# Patient Record
Sex: Male | Born: 1980 | Race: White | Hispanic: No | Marital: Married | Smoking: Never smoker
Health system: Southern US, Community
[De-identification: ages and names within clinical notes are randomized; demographics above are authoritative.]

## PROBLEM LIST (undated history)

## (undated) DIAGNOSIS — S32040A Wedge compression fracture of fourth lumbar vertebra, initial encounter for closed fracture: Secondary | ICD-10-CM

## (undated) HISTORY — DX: Wedge compression fracture of fourth lumbar vertebra, initial encounter for closed fracture: S32.040A

---

## 2001-09-16 DIAGNOSIS — S32040A Wedge compression fracture of fourth lumbar vertebra, initial encounter for closed fracture: Secondary | ICD-10-CM

## 2001-09-16 HISTORY — DX: Wedge compression fracture of fourth lumbar vertebra, initial encounter for closed fracture: S32.040A

## 2001-09-16 HISTORY — PX: LACERATION REPAIR: SHX5168

## 2001-09-16 HISTORY — PX: OTHER SURGICAL HISTORY: SHX169

## 2002-02-16 ENCOUNTER — Encounter: Payer: Self-pay | Admitting: Surgery

## 2002-02-16 ENCOUNTER — Emergency Department (HOSPITAL_COMMUNITY): Admission: AC | Admit: 2002-02-16 | Discharge: 2002-02-16 | Payer: Self-pay

## 2006-04-28 ENCOUNTER — Ambulatory Visit: Payer: Self-pay | Admitting: Endocrinology

## 2007-05-30 ENCOUNTER — Encounter: Payer: Self-pay | Admitting: Endocrinology

## 2014-09-22 ENCOUNTER — Other Ambulatory Visit: Payer: Self-pay | Admitting: Allergy and Immunology

## 2014-09-22 ENCOUNTER — Ambulatory Visit
Admission: RE | Admit: 2014-09-22 | Discharge: 2014-09-22 | Disposition: A | Discharge status: Home / Self Care | Payer: BLUE CROSS/BLUE SHIELD | Source: Ambulatory Visit | Attending: Allergy and Immunology | Admitting: Allergy and Immunology

## 2014-09-22 DIAGNOSIS — R059 Cough, unspecified: Secondary | ICD-10-CM

## 2014-09-22 DIAGNOSIS — R05 Cough: Secondary | ICD-10-CM

## 2016-05-20 IMAGING — CR DG CHEST 2V
2 series · 2 of 2 positions shown · non-contrast
Comparison: None.

CLINICAL DATA: Chronic cough.

EXAM:
CHEST  2 VIEW

[view not recorded (1 of 2)]
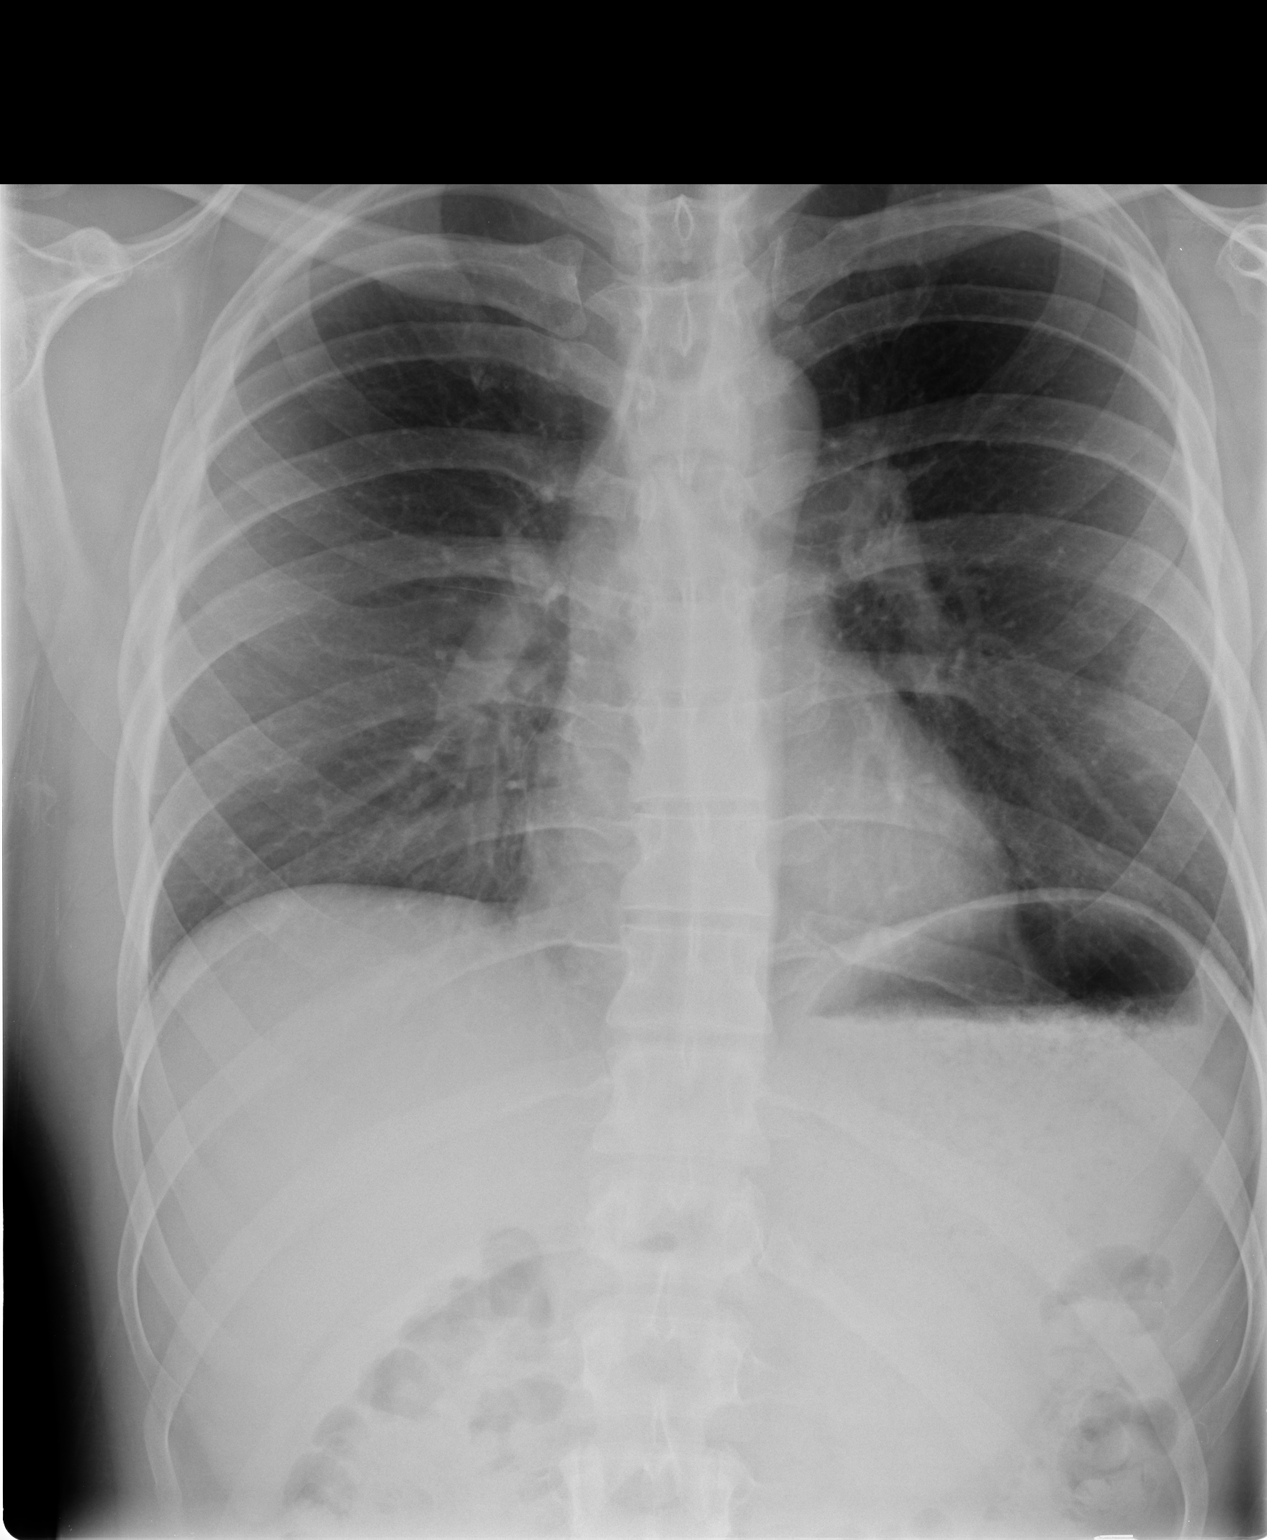

[view not recorded (2 of 2)]
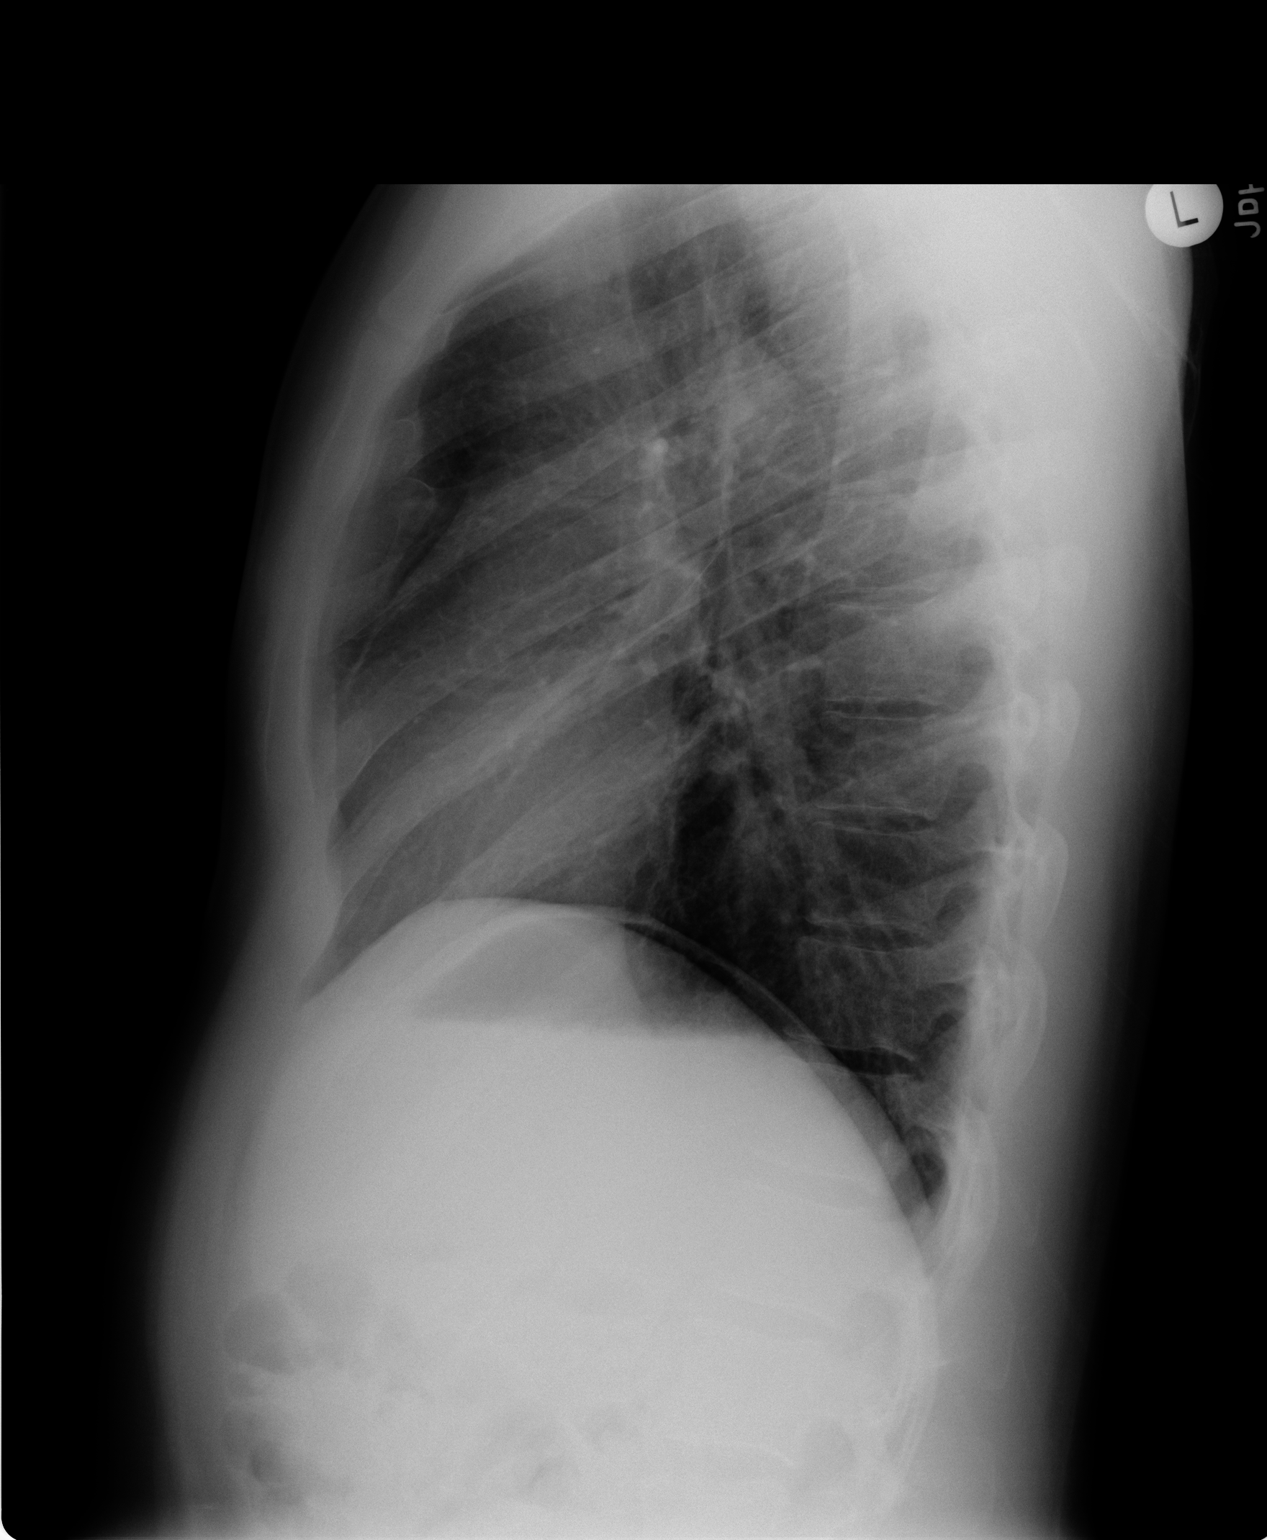

[2 of 2 positions shown; findings below may reference images not displayed]

FINDINGS: Mediastinum and hilar structures are normal. Lungs are clear. No
pleural effusion or pneumothorax. Heart size normal. Congenital
deformity right anterior fourth rib. No acute bony abnormality.
IMPRESSION: No active cardiopulmonary disease.

## 2016-06-03 ENCOUNTER — Encounter: Payer: Self-pay | Admitting: Internal Medicine

## 2016-07-03 ENCOUNTER — Encounter (INDEPENDENT_AMBULATORY_CARE_PROVIDER_SITE_OTHER): Payer: Self-pay

## 2016-07-03 ENCOUNTER — Ambulatory Visit (INDEPENDENT_AMBULATORY_CARE_PROVIDER_SITE_OTHER): Payer: BLUE CROSS/BLUE SHIELD | Admitting: Internal Medicine

## 2016-07-03 ENCOUNTER — Encounter: Payer: Self-pay | Admitting: Internal Medicine

## 2016-07-03 ENCOUNTER — Other Ambulatory Visit (INDEPENDENT_AMBULATORY_CARE_PROVIDER_SITE_OTHER): Payer: BLUE CROSS/BLUE SHIELD

## 2016-07-03 VITALS — BP 114/74 | HR 68 | Ht 74.0 in | Wt 191.0 lb

## 2016-07-03 DIAGNOSIS — Z8349 Family history of other endocrine, nutritional and metabolic diseases: Secondary | ICD-10-CM | POA: Diagnosis not present

## 2016-07-03 DIAGNOSIS — R748 Abnormal levels of other serum enzymes: Secondary | ICD-10-CM

## 2016-07-03 LAB — HEPATIC FUNCTION PANEL
ALBUMIN: 4.6 g/dL (ref 3.5–5.2)
ALT: 54 U/L — AB (ref 0–53)
AST: 39 U/L — ABNORMAL HIGH (ref 0–37)
Alkaline Phosphatase: 61 U/L (ref 39–117)
Bilirubin, Direct: 0.1 mg/dL (ref 0.0–0.3)
Total Bilirubin: 0.6 mg/dL (ref 0.2–1.2)
Total Protein: 7.6 g/dL (ref 6.0–8.3)

## 2016-07-03 LAB — FERRITIN: Ferritin: 119.1 ng/mL (ref 22.0–322.0)

## 2016-07-03 NOTE — Patient Instructions (Signed)
   Please go to the basement for the lab tests. We will call results and plans.  I appreciate the opportunity to care for you. Iva Booparl E. Gessner, MD, Clementeen GrahamFACG
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
## 2016-07-08 LAB — HEMOCHROMATOSIS DNA-PCR(C282Y,H63D)

## 2016-07-09 ENCOUNTER — Other Ambulatory Visit: Payer: Self-pay

## 2016-07-09 DIAGNOSIS — R748 Abnormal levels of other serum enzymes: Secondary | ICD-10-CM

## 2016-07-09 NOTE — Progress Notes (Signed)
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  Further w/ needed 1) Limited RUQ US - re: abnormal transaminases 2) ANA, ceruloplasmin, Hep B S Ag and Core Ab total and S ab, alpha-1 antitrypsin level, TTG Ab and IgA level, mitochondrial Abs

## 2016-07-10 ENCOUNTER — Other Ambulatory Visit (INDEPENDENT_AMBULATORY_CARE_PROVIDER_SITE_OTHER): Payer: BLUE CROSS/BLUE SHIELD

## 2016-07-10 ENCOUNTER — Ambulatory Visit (HOSPITAL_COMMUNITY)
Admission: RE | Admit: 2016-07-10 | Discharge: 2016-07-10 | Disposition: A | Discharge status: Home / Self Care | Payer: BLUE CROSS/BLUE SHIELD | Source: Ambulatory Visit | Attending: Internal Medicine | Admitting: Internal Medicine

## 2016-07-10 DIAGNOSIS — R748 Abnormal levels of other serum enzymes: Secondary | ICD-10-CM

## 2016-07-10 DIAGNOSIS — K8020 Calculus of gallbladder without cholecystitis without obstruction: Secondary | ICD-10-CM | POA: Insufficient documentation

## 2016-07-10 LAB — IGA: IGA: 333 mg/dL (ref 68–378)

## 2016-07-11 LAB — TISSUE TRANSGLUTAMINASE, IGA: TISSUE TRANSGLUTAMINASE AB, IGA: 1 U/mL (ref <4)

## 2016-07-11 LAB — HEPATITIS B CORE ANTIBODY, TOTAL: HEP B C TOTAL AB: NONREACTIVE

## 2016-07-11 LAB — HEPATITIS B SURFACE ANTIGEN: Hepatitis B Surface Ag: NEGATIVE

## 2016-07-11 LAB — ANA: Anti Nuclear Antibody(ANA): NEGATIVE

## 2016-07-11 LAB — HEPATITIS B SURFACE ANTIBODY, QUANTITATIVE: HEPATITIS B-POST: 50.2 m[IU]/mL

## 2016-07-12 LAB — CERULOPLASMIN: CERULOPLASMIN: 27 mg/dL (ref 18–36)

## 2016-07-12 LAB — ALPHA-1-ANTITRYPSIN: A-1 Antitrypsin, Ser: 112 mg/dL (ref 83–199)

## 2016-07-12 LAB — MITOCHONDRIAL ANTIBODIES: Mitochondrial M2 Ab, IgG: <=20 Units (ref <=20.0)

## 2016-07-15 NOTE — Progress Notes (Signed)
US showed gallstones - not a problem - not causing abnl LFT's and not having sxs The blood tests looking for a cause of LFT abnormalities are all ok  One other possible cause is skeletal muscle releae - is he a heavy exerciser? Does he have muscle pain?

## 2016-07-16 ENCOUNTER — Other Ambulatory Visit: Payer: Self-pay

## 2016-07-16 DIAGNOSIS — R7989 Other specified abnormal findings of blood chemistry: Secondary | ICD-10-CM

## 2016-07-16 DIAGNOSIS — R945 Abnormal results of liver function studies: Principal | ICD-10-CM

## 2016-07-16 NOTE — Progress Notes (Signed)
OK - cause of abnormal transaminases is unclear then  I would recommend periodic check of the LFT's A liver biopsy would tell us more but I am not inclined to do that unless would be necessary to satisfy insurance company.  Repeat LFT's in 3 months  He did this for insurance purposes - what does he need from me to help with that - ? Letter?

## 2016-07-23 ENCOUNTER — Encounter: Payer: Self-pay | Admitting: Internal Medicine

## 2017-04-07 DIAGNOSIS — J029 Acute pharyngitis, unspecified: Secondary | ICD-10-CM | POA: Diagnosis not present

## 2017-07-21 DIAGNOSIS — Z23 Encounter for immunization: Secondary | ICD-10-CM | POA: Diagnosis not present

## 2018-01-21 IMAGING — US US ABDOMEN LIMITED
1 series · 14 of 25 positions shown · non-contrast
Comparison: None.

CLINICAL DATA: Elevated liver function tests

EXAM:
US ABDOMEN LIMITED - RIGHT UPPER QUADRANT

[Series 1: us abdomen limited · 0.23mm/px · 14 of 54 slices shown]
[im 1/54]
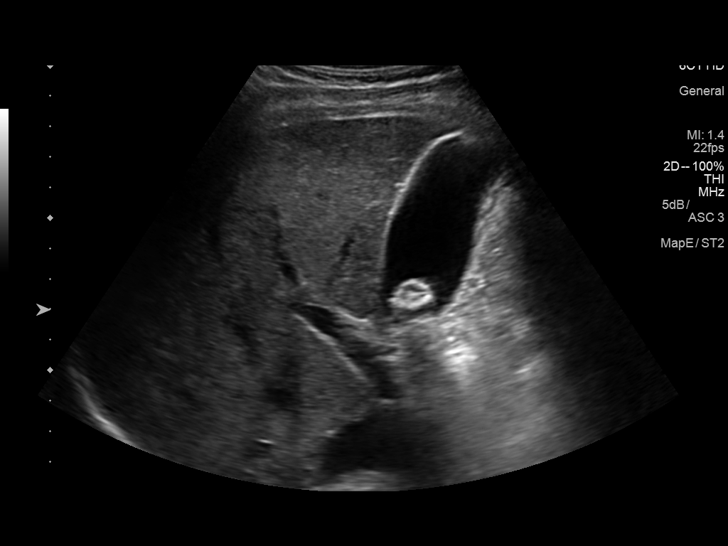
[im 5/54]
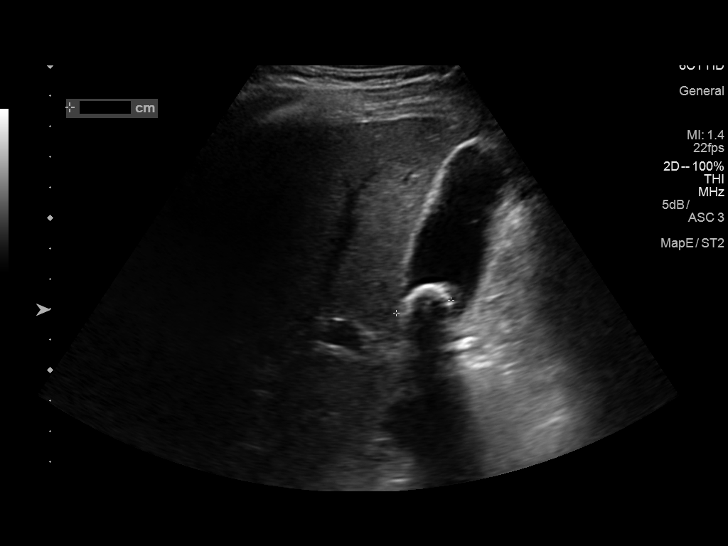
[im 9/54]
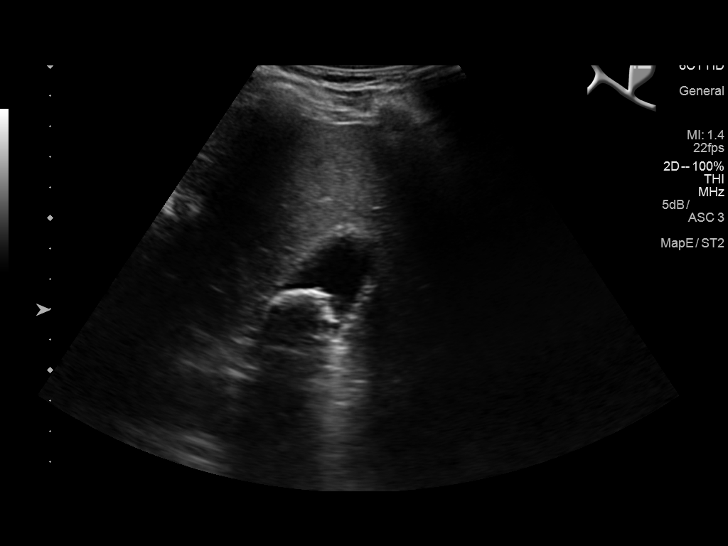
[im 14/54]
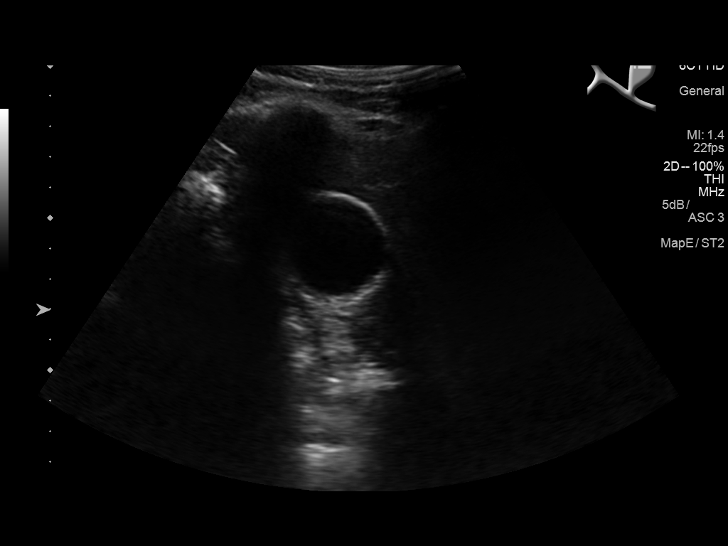
[im 18/54]
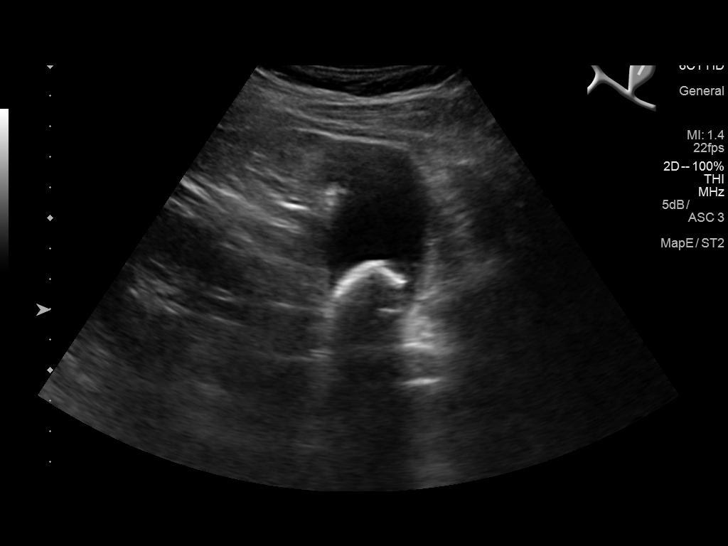
[im 20/54]
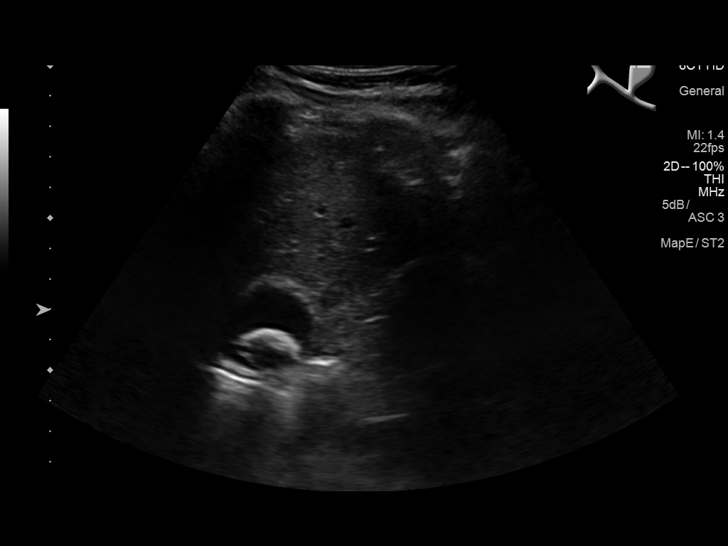
[im 25/54]
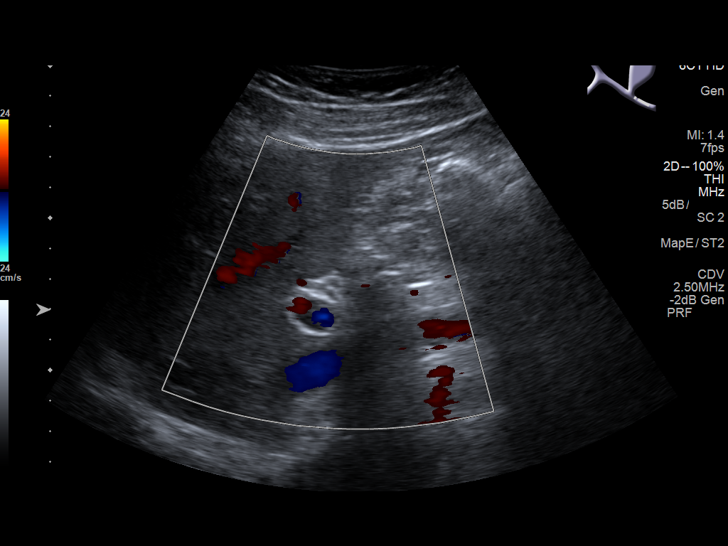
[im 29/54]
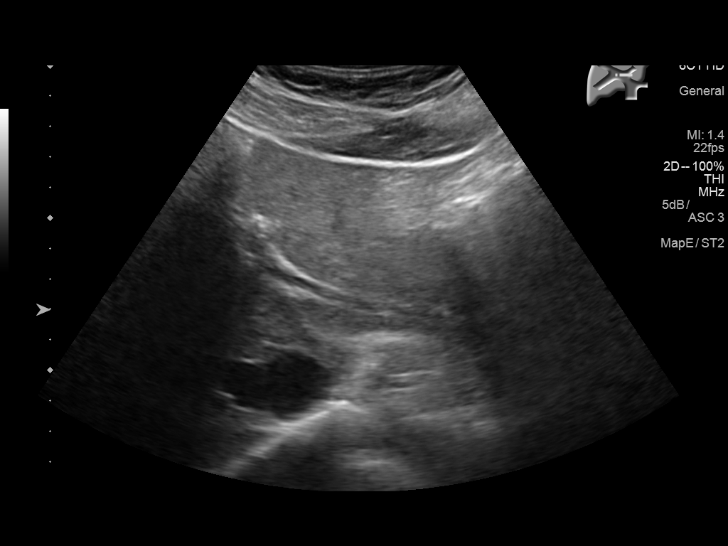
[im 34/54]
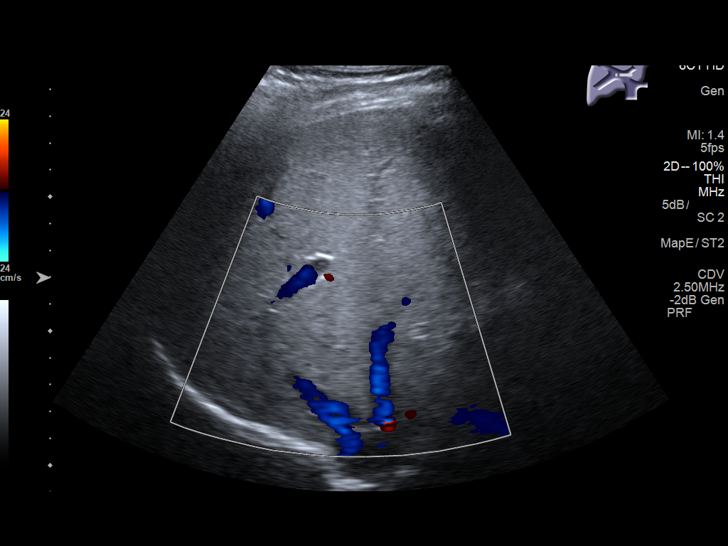
[im 36/54]
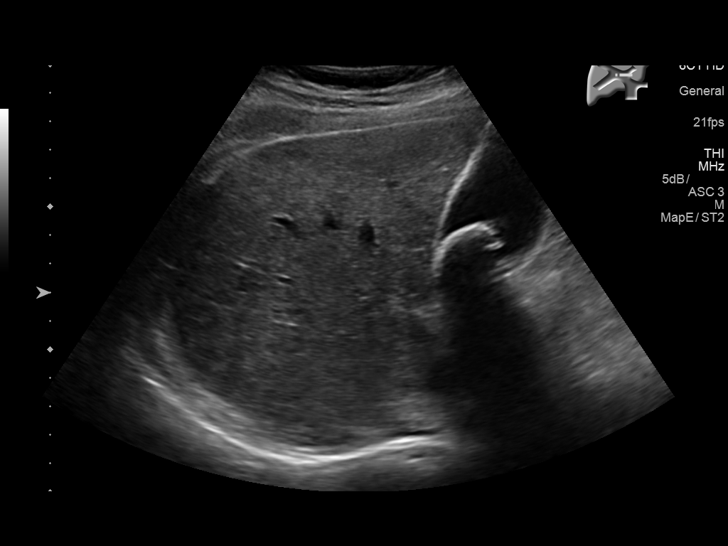
[im 40/54]
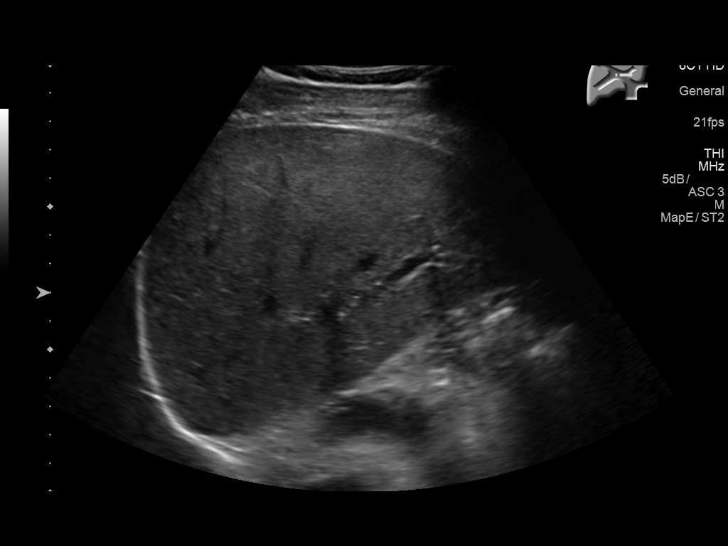
[im 45/54]
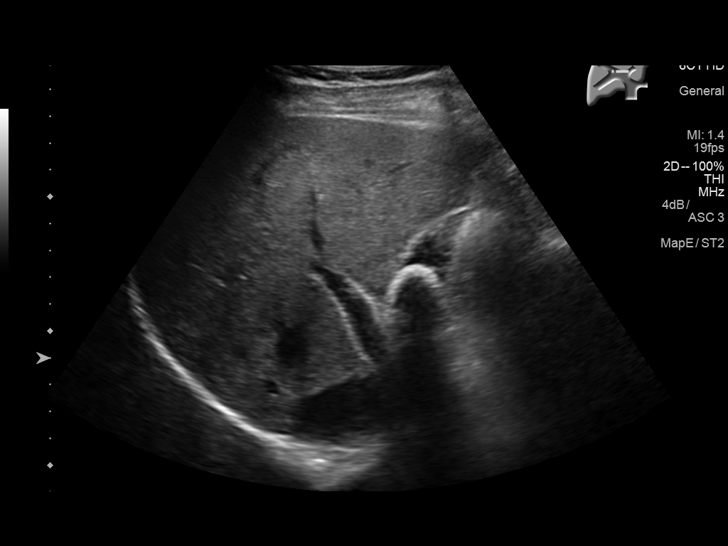
[im 49/54]
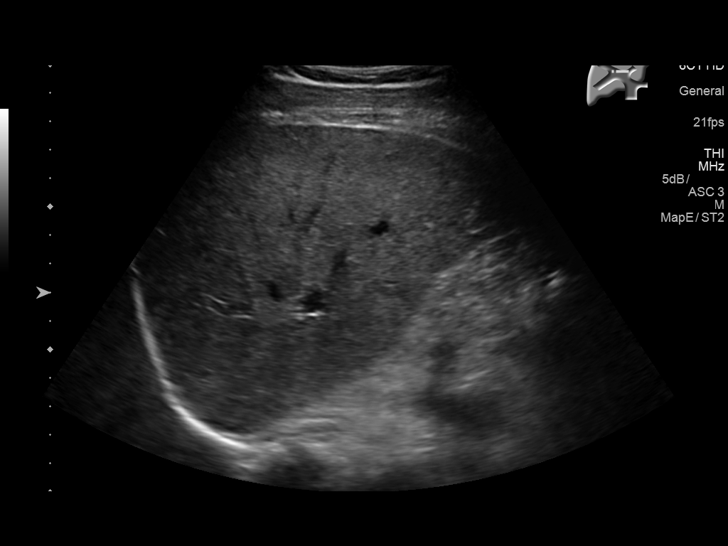
[im 54/54]
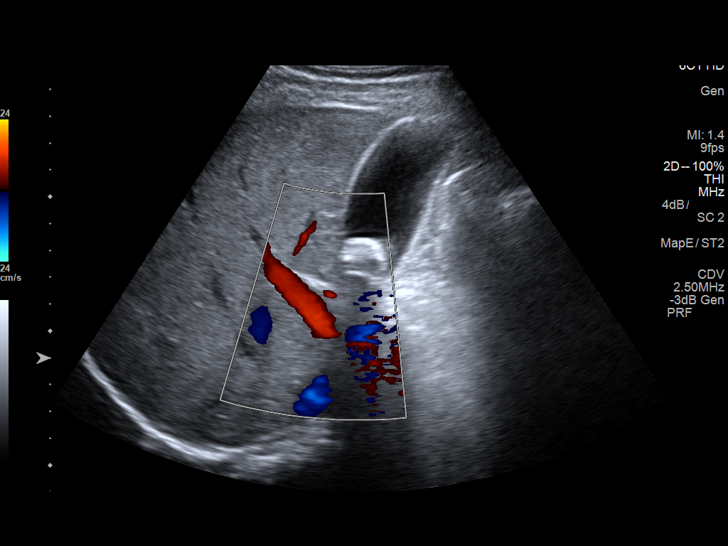

[14 of 25 positions shown; findings below may reference images not displayed]

FINDINGS: Gallbladder:

There is a 1.9 cm gallstone no wall thickening, Murphy sign, or
pericholecystic fluid.

Common bile duct:

Diameter: 2.1 mm

Liver:

No focal lesion identified. Within normal limits in parenchymal
echogenicity.
IMPRESSION: Cholelithiasis.

## 2018-10-30 DIAGNOSIS — E559 Vitamin D deficiency, unspecified: Secondary | ICD-10-CM | POA: Diagnosis not present

## 2018-10-30 DIAGNOSIS — R5383 Other fatigue: Secondary | ICD-10-CM | POA: Diagnosis not present

## 2018-10-30 DIAGNOSIS — R945 Abnormal results of liver function studies: Secondary | ICD-10-CM | POA: Diagnosis not present

## 2018-11-03 DIAGNOSIS — R5383 Other fatigue: Secondary | ICD-10-CM | POA: Diagnosis not present

## 2018-11-13 DIAGNOSIS — R5383 Other fatigue: Secondary | ICD-10-CM | POA: Diagnosis not present

## 2019-11-19 ENCOUNTER — Ambulatory Visit: Payer: Self-pay | Attending: Internal Medicine

## 2019-11-19 DIAGNOSIS — Z23 Encounter for immunization: Secondary | ICD-10-CM | POA: Insufficient documentation

## 2019-11-19 NOTE — Progress Notes (Signed)
   Covid-19 Vaccination Clinic  Name:  Mitchell Arellano    MRN: [id number] DOB: 1981-03-02  11/19/2019  [name] was observed post Covid-19 immunization for 15 minutes without incident. He was provided with Vaccine Information Sheet and instruction to access the V-Safe system.   [name] was instructed to call 911 with any severe reactions post vaccine: Marland Kitchen Difficulty breathing  . Swelling of face and throat  . A fast heartbeat  . A bad rash all over body  . Dizziness and weakness

## 2019-12-15 ENCOUNTER — Ambulatory Visit: Payer: Self-pay | Attending: Internal Medicine

## 2019-12-15 DIAGNOSIS — Z23 Encounter for immunization: Secondary | ICD-10-CM

## 2019-12-15 NOTE — Progress Notes (Signed)
   Covid-19 Vaccination Clinic  Name:  Mitchell Arellano    MRN: [id number] DOB: 11-07-1980  12/15/2019  Mr. Hamre was observed post Covid-19 immunization for 15 minutes without incident. He was provided with Vaccine Information Sheet and instruction to access the V-Safe system.   Mr. Carton was instructed to call 911 with any severe reactions post vaccine: Marland Kitchen Difficulty breathing  . Swelling of face and throat  . A fast heartbeat  . A bad rash all over body  . Dizziness and weakness   Immunizations Administered    Name Date Dose VIS Date Route   Pfizer COVID-19 Vaccine 12/15/2019 12:56 PM 0.3 mL 08/27/2019 Intramuscular   Manufacturer: [employer], [employer]   Lot: [id number]   NDC: [address]-3

## 2024-07-18 ENCOUNTER — Emergency Department (HOSPITAL_BASED_OUTPATIENT_CLINIC_OR_DEPARTMENT_OTHER)

## 2024-07-18 ENCOUNTER — Emergency Department (HOSPITAL_BASED_OUTPATIENT_CLINIC_OR_DEPARTMENT_OTHER)
Admission: EM | Admit: 2024-07-18 | Discharge: 2024-07-19 | Disposition: A | Discharge status: Home / Self Care | Attending: Emergency Medicine | Admitting: Emergency Medicine

## 2024-07-18 ENCOUNTER — Encounter (HOSPITAL_BASED_OUTPATIENT_CLINIC_OR_DEPARTMENT_OTHER): Payer: Self-pay | Admitting: Emergency Medicine

## 2024-07-18 DIAGNOSIS — R55 Syncope and collapse: Secondary | ICD-10-CM | POA: Diagnosis not present

## 2024-07-18 DIAGNOSIS — Z23 Encounter for immunization: Secondary | ICD-10-CM | POA: Diagnosis not present

## 2024-07-18 DIAGNOSIS — S098XXA Other specified injuries of head, initial encounter: Secondary | ICD-10-CM

## 2024-07-18 DIAGNOSIS — S0101XA Laceration without foreign body of scalp, initial encounter: Secondary | ICD-10-CM | POA: Diagnosis not present

## 2024-07-18 DIAGNOSIS — Y92511 Restaurant or cafe as the place of occurrence of the external cause: Secondary | ICD-10-CM | POA: Diagnosis not present

## 2024-07-18 DIAGNOSIS — S0990XA Unspecified injury of head, initial encounter: Secondary | ICD-10-CM | POA: Diagnosis present

## 2024-07-18 DIAGNOSIS — W1830XA Fall on same level, unspecified, initial encounter: Secondary | ICD-10-CM | POA: Diagnosis not present

## 2024-07-18 LAB — URINE DRUG SCREEN
Amphetamines: NEGATIVE
Barbiturates: NEGATIVE
Benzodiazepines: NEGATIVE
Cocaine: NEGATIVE
Fentanyl: NEGATIVE
Methadone Scn, Ur: NEGATIVE
Opiates: NEGATIVE
Tetrahydrocannabinol: POSITIVE — AB

## 2024-07-18 LAB — CBC WITH DIFFERENTIAL/PLATELET
Abs Immature Granulocytes: 0.01 K/uL (ref 0.00–0.07)
Basophils Absolute: 0 K/uL (ref 0.0–0.1)
Basophils Relative: 0 %
Eosinophils Absolute: 0.1 K/uL (ref 0.0–0.5)
Eosinophils Relative: 1 %
HCT: 47.4 % (ref 39.0–52.0)
Hemoglobin: 16.7 g/dL (ref 13.0–17.0)
Immature Granulocytes: 0 %
Lymphocytes Relative: 24 %
Lymphs Abs: 1.3 K/uL (ref 0.7–4.0)
MCH: 32.9 pg (ref 26.0–34.0)
MCHC: 35.2 g/dL (ref 30.0–36.0)
MCV: 93.3 fL (ref 80.0–100.0)
Monocytes Absolute: 0.4 K/uL (ref 0.1–1.0)
Monocytes Relative: 8 %
Neutro Abs: 3.8 K/uL (ref 1.7–7.7)
Neutrophils Relative %: 67 %
Platelets: 266 K/uL (ref 150–400)
RBC: 5.08 MIL/uL (ref 4.22–5.81)
RDW: 12.9 % (ref 11.5–15.5)
WBC: 5.6 K/uL (ref 4.0–10.5)
nRBC: 0 % (ref 0.0–0.2)

## 2024-07-18 LAB — BASIC METABOLIC PANEL WITH GFR
Anion gap: 12 (ref 5–15)
BUN: 14 mg/dL (ref 6–20)
CO2: 27 mmol/L (ref 22–32)
Calcium: 9.9 mg/dL (ref 8.9–10.3)
Chloride: 101 mmol/L (ref 98–111)
Creatinine, Ser: 1.04 mg/dL (ref 0.61–1.24)
GFR, Estimated: >60 mL/min (ref >60)
Glucose, Bld: 114 mg/dL — ABNORMAL HIGH (ref 70–99)
Potassium: 3.5 mmol/L (ref 3.5–5.1)
Sodium: 140 mmol/L (ref 135–145)

## 2024-07-18 LAB — LACTIC ACID, PLASMA
Lactic Acid, Venous: 2.2 mmol/L (ref 0.5–1.9)
Lactic Acid, Venous: 1.5 mmol/L (ref 0.5–1.9)

## 2024-07-18 LAB — TROPONIN T, HIGH SENSITIVITY
Troponin T High Sensitivity: <15 ng/L (ref 0–19)
Troponin T High Sensitivity: <15 ng/L (ref 0–19)

## 2024-07-18 LAB — ETHANOL: Alcohol, Ethyl (B): <15 mg/dL (ref <15)

## 2024-07-18 LAB — CK: Total CK: 123 U/L (ref 49–397)

## 2024-07-18 LAB — CBG MONITORING, ED: Glucose-Capillary: 140 mg/dL — ABNORMAL HIGH (ref 70–99)

## 2024-07-18 MED ORDER — TETANUS-DIPHTH-ACELL PERTUSSIS 5-2-15.5 LF-MCG/0.5 IM SUSP
0.5000 mL | Freq: Once | INTRAMUSCULAR | Status: AC
  Administered 2024-07-18: 0.5 mL via INTRAMUSCULAR
  Filled 2024-07-18: qty 0.5

## 2024-07-18 MED ORDER — SODIUM CHLORIDE 0.9 % IV BOLUS
1000.0000 mL | Freq: Once | INTRAVENOUS | Status: AC
  Administered 2024-07-18: 1000 mL via INTRAVENOUS

## 2024-07-18 MED ORDER — LIDOCAINE-EPINEPHRINE (PF) 2 %-1:200000 IJ SOLN
10.0000 mL | Freq: Once | INTRAMUSCULAR | Status: AC
  Administered 2024-07-18: 10 mL
  Filled 2024-07-18: qty 20

## 2024-07-18 NOTE — ED Provider Notes (Signed)
 [place] EMERGENCY DEPARTMENT AT [facility] Provider Note   CSN: [id number] Arrival date & time: 07/18/24  1916     Patient presents with: Loss of Consciousness   Mitchell Arellano is a 43 y.o. male.  {Add pertinent medical, surgical, social history, OB history to YEP:67052} Patient is a 43 year old male presenting for syncope.  Patient states he was at a restaurant eating when he got lightheaded, dizzy, nauseous.  He states he got up to walk to the bathroom and passed out on his way back.  He states was only out for couple seconds.  His wife states he was acting appropriately soon as he woke up.  They deny any shaking or seizure-like activity.  Denies any recent illness including no fevers, chills, vomiting, coughing, abdominal pain, or diarrhea.  Denies any chest pain or shortness of breath.  He admits to a headache currently.  He denies any spinal pain from the fall.  Admits to some left-sided shoulder pain that he states is minimal.  Head lack present.  The history is provided by the patient. No language interpreter was used.  Loss of Consciousness Associated symptoms: no chest pain, no fever, no palpitations, no seizures, no shortness of breath and no vomiting        Prior to Admission medications   Not on File    Allergies: Patient has no known allergies.    Review of Systems  Constitutional:  Negative for chills and fever.  HENT:  Negative for ear pain and sore throat.   Eyes:  Negative for pain and visual disturbance.  Respiratory:  Negative for cough and shortness of breath.   Cardiovascular:  Positive for syncope. Negative for chest pain and palpitations.  Gastrointestinal:  Negative for abdominal pain and vomiting.  Genitourinary:  Negative for dysuria and hematuria.  Musculoskeletal:  Negative for arthralgias and back pain.  Skin:  Positive for wound. Negative for color change and rash.  Neurological:  Positive for syncope. Negative for seizures.  All other  systems reviewed and are negative.   Updated Vital Signs BP 122/86   Pulse 63   Temp 97.8 F (36.6 C) (Oral)   Resp 18   SpO2 100%   Physical Exam Vitals and nursing note reviewed.  Constitutional:      General: He is not in acute distress.    Appearance: He is well-developed.  HENT:     Head: Normocephalic and atraumatic.  Eyes:     General: Lids are normal. Vision grossly intact.     Conjunctiva/sclera: Conjunctivae normal.     Pupils: Pupils are equal, round, and reactive to light.  Cardiovascular:     Rate and Rhythm: Normal rate and regular rhythm.     Heart sounds: No murmur heard. Pulmonary:     Effort: Pulmonary effort is normal. No respiratory distress.     Breath sounds: Normal breath sounds.  Abdominal:     Palpations: Abdomen is soft.     Tenderness: There is no abdominal tenderness.  Musculoskeletal:        General: No swelling.     Cervical back: Neck supple.  Skin:    General: Skin is warm and dry.     Capillary Refill: Capillary refill takes less than 2 seconds.  Neurological:     Mental Status: He is alert and oriented to person, place, and time.     GCS: GCS eye subscore is 4. GCS verbal subscore is 5. GCS motor subscore is 6.  Cranial Nerves: Cranial nerves 2-12 are intact.     Sensory: Sensation is intact.     Motor: Motor function is intact.     Coordination: Coordination is intact.     Gait: Gait is intact.  Psychiatric:        Mood and Affect: Mood normal.     (all labs ordered are listed, but only abnormal results are displayed) Labs Reviewed  BASIC METABOLIC PANEL WITH GFR - Abnormal; Notable for the following components:      Result Value   Glucose, Bld 114 (*)    All other components within normal limits  URINE DRUG SCREEN - Abnormal; Notable for the following components:   Tetrahydrocannabinol POSITIVE (*)    All other components within normal limits  LACTIC ACID, PLASMA - Abnormal; Notable for the following components:    Lactic Acid, Venous 2.2 (*)    All other components within normal limits  CBG MONITORING, ED - Abnormal; Notable for the following components:   Glucose-Capillary 140 (*)    All other components within normal limits  CBC WITH DIFFERENTIAL/PLATELET  ETHANOL  CK  LACTIC ACID, PLASMA  TROPONIN T, HIGH SENSITIVITY  TROPONIN T, HIGH SENSITIVITY    EKG: None  Radiology: CT Head Wo Contrast Result Date: 07/18/2024 EXAM: CT HEAD WITHOUT CONTRAST 07/18/2024 08:22:24 PM TECHNIQUE: CT of the head was performed without the administration of intravenous contrast. Automated exposure control, iterative reconstruction, and/or weight based adjustment of the mA/kV was utilized to reduce the radiation dose to as low as reasonably achievable. COMPARISON: None available. CLINICAL HISTORY: Syncope, head lac, dizziness. FINDINGS: BRAIN AND VENTRICLES: No acute hemorrhage. No evidence of acute infarct. No hydrocephalus. No extra-axial collection. No mass effect or midline shift. ORBITS: No acute abnormality. SINUSES: No acute abnormality. SOFT TISSUES AND SKULL: No acute soft tissue abnormality. No skull fracture. IMPRESSION: 1. No acute intracranial abnormality. Electronically signed by: Franky Crease MD 07/18/2024 08:25 PM EST RP Workstation: [id number]   DG Chest Portable 1 View Result Date: 07/18/2024 EXAM: 1 VIEW(S) XRAY OF THE CHEST 07/18/2024 08:09:00 PM COMPARISON: Chest x-ray 09/22/2014. CLINICAL HISTORY: syncope FINDINGS: LUNGS AND PLEURA: No focal pulmonary opacity. No pulmonary edema. No pleural effusion. No pneumothorax. HEART AND MEDIASTINUM: No acute abnormality of the cardiac and mediastinal silhouettes. BONES AND SOFT TISSUES: No acute osseous abnormality. IMPRESSION: 1. No acute cardiopulmonary process. Electronically signed by: Morgane Naveau MD 07/18/2024 08:18 PM EST RP Workstation: [id number]    {Document cardiac monitor, telemetry assessment procedure when appropriate:32947} Procedures    Medications Ordered in the ED  Tdap (ADACEL) injection 0.5 mL (has no administration in time range)  lidocaine-EPINEPHrine (XYLOCAINE W/EPI) 2 %-1:200000 (PF) injection 10 mL (has no administration in time range)  sodium chloride 0.9 % bolus 1,000 mL (0 mLs Intravenous Stopped 07/18/24 2132)      {Click here for ABCD2, HEART and other calculators REFRESH Note before signing:1}                              Medical Decision Making Amount and/or Complexity of Data Reviewed Labs: ordered. Radiology: ordered.  Risk Prescription drug management.   43 year old male presenting for syncope.  Patient is alert and oriented x 3, no acute distress, afebrile, stable vital signs.  Syncope: - Doubt cardiac syncope.  No chest pain or shortness of breath. - No history of seizures, shaking-like activity, or seizure-like activity reported by bystanders. - Stable POC glucose - Stable  vital signs including blood pressure. - No neurovascular deficits.  Headache present.  CT demonstrates no acute process.  4 cm head laceration to the frontal region.  No foreign bodies.  Wound irrigated and cleaned and closed with sutures.  Follow-up recommendations given.  Patient in no distress and overall condition improved here in the ED. Detailed discussions were had with the patient regarding current findings, and need for close f/u with PCP or on call doctor. The patient has been instructed to return immediately if the symptoms worsen in any way for re-evaluation. Patient verbalized understanding and is in agreement with current care plan. All questions answered prior to discharge.      {Document critical care time when appropriate  Document review of labs and clinical decision tools ie CHADS2VASC2, etc  Document your independent review of radiology images and any outside records  Document your discussion with family members, caretakers and with consultants  Document social determinants of health affecting  pt's care  Document your decision making why or why not admission, treatments were needed:32947:::1}   Final diagnoses:  None    ED Discharge Orders     None

## 2024-07-18 NOTE — ED Triage Notes (Signed)
 Stood up from table at [employer] and passed out Possibly hit head on door frame  feels tired, nausea  Large gash on head, pain in face, bit tongue on fall  Happened around 6:45pm

## 2024-07-18 NOTE — Discharge Instructions (Signed)
 Please follow up with your primary care provider or any health care provider for suture removal in 7-10 days.   Drink 2 glasses of water tonight and multiple tomorrow. Return to emergency department if syncope reoccurs.
